# Patient Record
Sex: Male | Born: 2000 | Race: Black or African American | Hispanic: No | Marital: Single | Smoking: Never smoker
Health system: Southern US, Community
[De-identification: ages and names within clinical notes are randomized; demographics above are authoritative.]

## PROBLEM LIST (undated history)

## (undated) HISTORY — PX: KNEE SURGERY: SHX244

---
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
## 2019-05-17 ENCOUNTER — Emergency Department (HOSPITAL_COMMUNITY)
Admission: EM | Admit: 2019-05-17 | Discharge: 2019-05-17 | Disposition: A | Discharge status: Home / Self Care | Payer: BC Managed Care – PPO | Attending: Emergency Medicine | Admitting: Emergency Medicine

## 2019-05-17 ENCOUNTER — Emergency Department (HOSPITAL_COMMUNITY): Payer: BC Managed Care – PPO

## 2019-05-17 ENCOUNTER — Encounter (HOSPITAL_COMMUNITY): Payer: Self-pay

## 2019-05-17 ENCOUNTER — Other Ambulatory Visit: Payer: Self-pay

## 2019-05-17 DIAGNOSIS — M25561 Pain in right knee: Secondary | ICD-10-CM

## 2019-05-17 DIAGNOSIS — Y9361 Activity, american tackle football: Secondary | ICD-10-CM | POA: Insufficient documentation

## 2019-05-17 DIAGNOSIS — Z79899 Other long term (current) drug therapy: Secondary | ICD-10-CM | POA: Diagnosis not present

## 2019-05-17 DIAGNOSIS — Y929 Unspecified place or not applicable: Secondary | ICD-10-CM | POA: Diagnosis not present

## 2019-05-17 DIAGNOSIS — X500XXA Overexertion from strenuous movement or load, initial encounter: Secondary | ICD-10-CM | POA: Insufficient documentation

## 2019-05-17 DIAGNOSIS — Y999 Unspecified external cause status: Secondary | ICD-10-CM | POA: Diagnosis not present

## 2019-05-17 MED ORDER — NAPROXEN 250 MG PO TABS
500.0000 mg | ORAL_TABLET | Freq: Once | ORAL | Status: AC
  Administered 2019-05-17: 500 mg via ORAL
  Filled 2019-05-17: qty 2

## 2019-05-17 MED ORDER — NAPROXEN 500 MG PO TABS: 500.0000 mg | ORAL_TABLET | Freq: Two times a day (BID) | ORAL | 0 refills | Status: AC

## 2019-05-17 NOTE — ED Provider Notes (Signed)
[facility]   CSN: [id number] Arrival date & time: 05/17/19  2036    History   Chief Complaint Chief Complaint  Patient presents with  . Knee Injury    HPI Douglas Mendez is a 18 y.o. male.     18 y.o male with no PMH presents to the ED with a chief complaint of right knee after football. Patient reports he was playing football when he tried to run, after having his right leg step forward, he felt "a pop on his knee" after this he was able to ambulate but with difficulty. He reports pain along the top of the right knee worse with ambulation along with weight bearing. He denies any medication for relieve in symptoms. Patient denies any other medical history.   The history is provided by the patient.    History reviewed. No pertinent past medical history.  There are no active problems to display for this patient.   History reviewed. No pertinent surgical history.      Home Medications    Prior to Admission medications   Medication Sig Start Date End Date Taking? Authorizing Provider  meloxicam (MOBIC) 15 MG tablet Take 1 tablet (15 mg total) by mouth daily. 09/18/17   Cuthriell, Delorise RoyalsJonathan D, PA-C  naproxen (NAPROSYN) 500 MG tablet Take 1 tablet (500 mg total) by mouth 2 (two) times daily for 7 days. 05/17/19 05/24/19  Claude MangesSoto, Ciela Mahajan, PA-C    Family History No family history on file.  Social History Social History   Tobacco Use  . Smoking status: Not on file  Substance Use Topics  . Alcohol use: Not on file  . Drug use: Not on file     Allergies   Patient has no known allergies.   Review of Systems Review of Systems  Constitutional: Negative for fever.  Musculoskeletal: Positive for arthralgias, joint swelling and myalgias.     Physical Exam Updated Vital Signs BP (!) 115/99   Pulse 90   Temp 99 F (37.2 C) (Oral)   Resp 16   SpO2 100%   Physical Exam Vitals signs and nursing note reviewed.   Constitutional:      Appearance: He is well-developed.  HENT:     Head: Normocephalic and atraumatic.  Eyes:     General: No scleral icterus.    Pupils: Pupils are equal, round, and reactive to light.  Neck:     Musculoskeletal: Normal range of motion.  Cardiovascular:     Heart sounds: Normal heart sounds.  Pulmonary:     Effort: Pulmonary effort is normal.     Breath sounds: Normal breath sounds. No wheezing.  Chest:     Chest wall: No tenderness.  Abdominal:     General: Bowel sounds are normal. There is no distension.     Palpations: Abdomen is soft.     Tenderness: There is no abdominal tenderness.  Musculoskeletal:        General: No deformity.     Right knee: He exhibits decreased range of motion, swelling and effusion. He exhibits no ecchymosis, no deformity, no laceration, no erythema and normal alignment. Tenderness found. Patellar tendon tenderness noted.       Legs:     Comments: Pulses present, limited ROM due to pain   Skin:    General: Skin is warm and dry.  Neurological:     Mental Status: He is alert and oriented to person, place, and time.  ED Treatments / Results  Labs (all labs ordered are listed, but only abnormal results are displayed) Labs Reviewed - No data to display  EKG None  Radiology Dg Knee Complete 4 Views Right  Result Date: 05/17/2019 CLINICAL DATA:  Injury playing football today. Right knee pain and swelling. Initial encounter. EXAM: RIGHT KNEE - COMPLETE 4+ VIEW COMPARISON:  None. FINDINGS: No evidence of fracture or dislocation. A large knee joint effusion is seen. No evidence of arthropathy or other focal bone abnormality. Soft tissues are unremarkable. IMPRESSION: Large knee joint effusion. No evidence of fracture or dislocation. Electronically Signed   By: Marlaine Hind M.D.   On: 05/17/2019 21:41    Procedures Procedures (including critical care time)  Medications Ordered in ED Medications  naproxen (NAPROSYN) tablet 500  mg (has no administration in time range)     Initial Impression / Assessment and Plan / ED Course  I have reviewed the triage vital signs and the nursing notes.  Pertinent labs & imaging results that were available during my care of the patient were reviewed by me and considered in my medical decision making (see chart for details).    Patient with no PMH presents to the ED with a chief complaint of right knee pain, reports this occurred when he was playing football. Describes a popping sensation to the upper part of his right knee. No medication prior to arrival. During evaluation swelling noted to the upper patella region, limited ROM due to pain. Strength is limited with knee extension and flexion due to pain. DG right knee showed a large joint effusion. Will place patient in knee immobilizer along with crutches and orthopedic follow up. He will also be dc with NSAIDS and RICE therapy. He understands and agrees with management.    Portions of this note were generated with Lobbyist. Dictation errors may occur despite best attempts at proofreading.   Final Clinical Impressions(s) / ED Diagnoses   Final diagnoses:  Acute pain of right knee    ED Discharge Orders         Ordered    naproxen (NAPROSYN) 500 MG tablet  2 times daily     05/17/19 2216           Janeece Fitting, PA-C 05/17/19 2222    Lennice Sites, DO 05/17/19 2243

## 2019-05-17 NOTE — Discharge Instructions (Signed)
Please keep your knee on a brace until your orthopedist follow up. Call and schedule an appointment for further evaluation. You may ambulate with crutches and elevate your leg while resting at home. A prescription for antiinflammatories is attached to your chart, please take as prescribed.

## 2019-05-17 NOTE — ED Notes (Signed)
Patient verbalizes understanding of discharge instructions. Opportunity for questioning and answers were provided. Armband removed by staff, pt discharged from ED in wheelchair but ambulatory with crutches given.

## 2019-05-17 NOTE — ED Triage Notes (Signed)
Pt states he was playing football tonight and heard his right knee pop. Mild swelling noted, pt ambulatory.

## 2019-10-30 IMAGING — CR RIGHT KNEE - COMPLETE 4+ VIEW
4 series · 4 of 4 positions shown · non-contrast
Comparison: None.

CLINICAL DATA: Injury playing football today. Right knee pain and
swelling. Initial encounter.

EXAM:
RIGHT KNEE - COMPLETE 4+ VIEW

[knee ap]
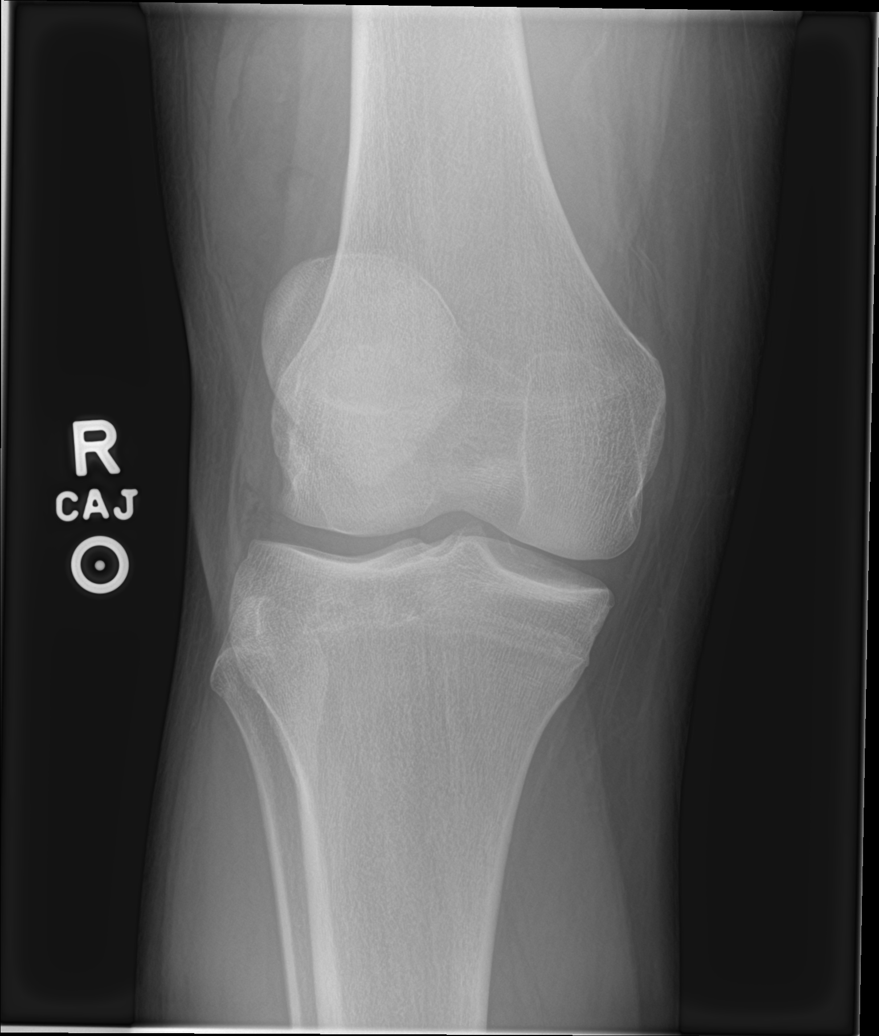

[knee lat]
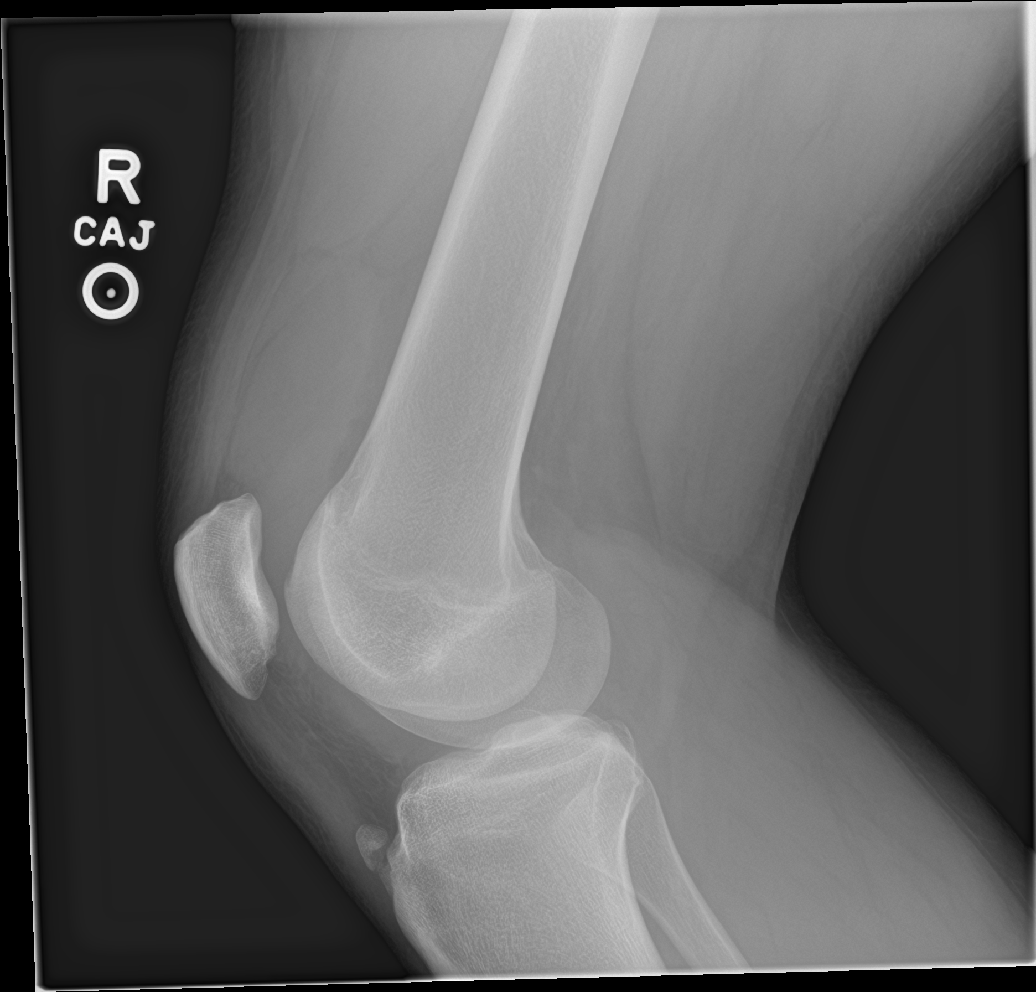

[knee obl (1 of 2)]
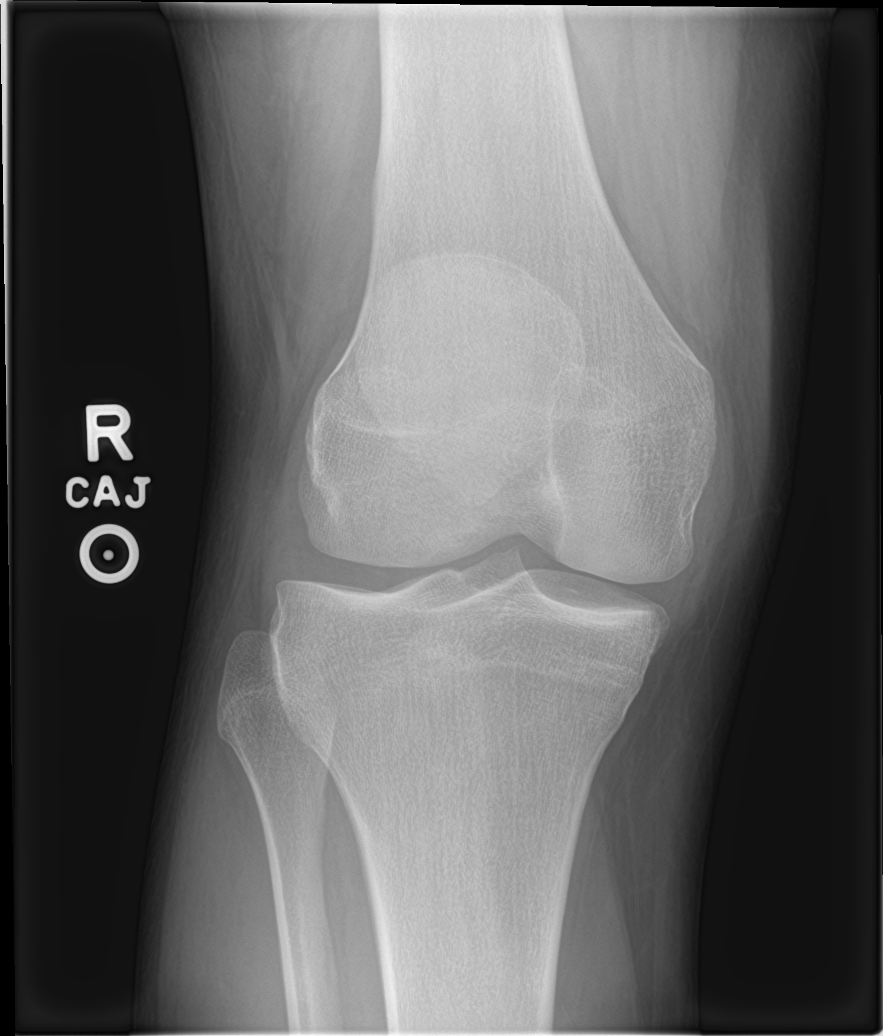

[knee obl (2 of 2)]
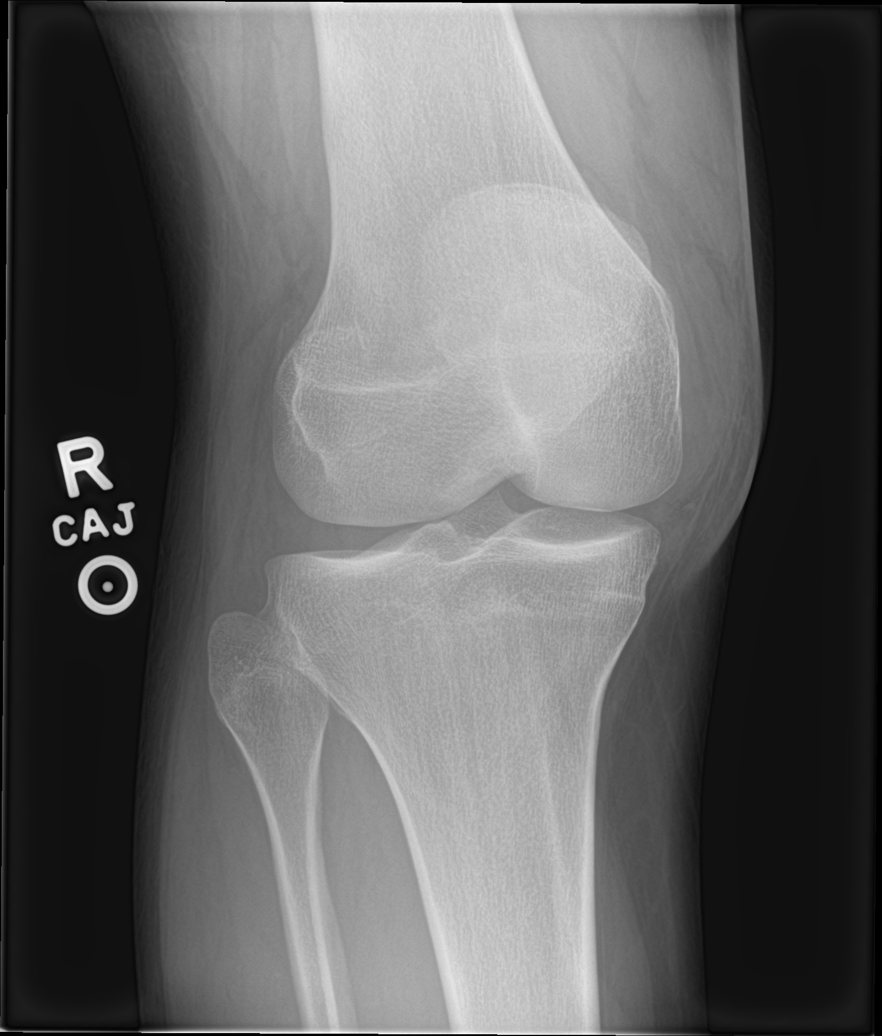

[4 of 4 positions shown; findings below may reference images not displayed]

FINDINGS: No evidence of fracture or dislocation. A large knee joint effusion
is seen. No evidence of arthropathy or other focal bone abnormality.
Soft tissues are unremarkable.
IMPRESSION: Large knee joint effusion. No evidence of fracture or dislocation.

## 2019-12-01 ENCOUNTER — Ambulatory Visit: Payer: BC Managed Care – PPO | Attending: Internal Medicine

## 2019-12-01 DIAGNOSIS — Z20822 Contact with and (suspected) exposure to covid-19: Secondary | ICD-10-CM

## 2019-12-02 LAB — NOVEL CORONAVIRUS, NAA: SARS-CoV-2, NAA: NOT DETECTED

## 2019-12-04 ENCOUNTER — Telehealth: Payer: Self-pay

## 2019-12-04 NOTE — Telephone Encounter (Signed)
Pt notified of negative COVID-19 results. Understanding verbalized. He requested to have his results faxed to [phone]. Attention: Bayard Hugger    [facility]

## 2019-12-04 NOTE — Telephone Encounter (Signed)
Result faxed as requested. 

## 2020-03-29 ENCOUNTER — Ambulatory Visit: Payer: BC Managed Care – PPO | Attending: Internal Medicine

## 2020-03-29 DIAGNOSIS — Z23 Encounter for immunization: Secondary | ICD-10-CM

## 2020-03-29 NOTE — Progress Notes (Signed)
   Covid-19 Vaccination Clinic  Name:  LADAVION SAVITZ    MRN: [id number] DOB: Nov 26, 2000  03/29/2020  Mr. Ratz was observed post Covid-19 immunization for 15 minutes without incident. He was provided with Vaccine Information Sheet and instruction to access the V-Safe system.   Mr. Relph was instructed to call 911 with any severe reactions post vaccine: Marland Kitchen Difficulty breathing  . Swelling of face and throat  . A fast heartbeat  . A bad rash all over body  . Dizziness and weakness   Immunizations Administered    Name Date Dose VIS Date Route   Pfizer COVID-19 Vaccine 03/29/2020 11:17 AM 0.3 mL 01/13/2019 Intramuscular   Manufacturer: [employer], [employer]   Lot: [id number]   NDC: [address]-7

## 2020-04-19 ENCOUNTER — Ambulatory Visit: Payer: BC Managed Care – PPO | Attending: Internal Medicine

## 2020-04-19 DIAGNOSIS — Z23 Encounter for immunization: Secondary | ICD-10-CM

## 2020-04-19 NOTE — Progress Notes (Signed)
   Covid-19 Vaccination Clinic  Name:  Douglas Mendez    MRN: [id number] DOB: 09/06/2001  04/19/2020  Douglas Mendez was observed post Covid-19 immunization for 15 minutes without incident. He was provided with Vaccine Information Sheet and instruction to access the V-Safe system.   Douglas Mendez was instructed to call 911 with any severe reactions post vaccine: Marland Kitchen Difficulty breathing  . Swelling of face and throat  . A fast heartbeat  . A bad rash all over body  . Dizziness and weakness   Immunizations Administered    Name Date Dose VIS Date Route   Pfizer COVID-19 Vaccine 04/19/2020  9:20 AM 0.3 mL 01/13/2019 Intramuscular   Manufacturer: [employer], [employer]   Lot: [id number]   NDC: [address]-4

## 2020-12-09 ENCOUNTER — Ambulatory Visit
Admission: RE | Admit: 2020-12-09 | Discharge: 2020-12-09 | Disposition: A | Discharge status: Home / Self Care | Payer: BC Managed Care – PPO | Source: Ambulatory Visit | Attending: Family Medicine | Admitting: Family Medicine

## 2020-12-09 ENCOUNTER — Other Ambulatory Visit: Payer: Self-pay

## 2020-12-09 VITALS — BP 109/46 | HR 67 | Temp 98.2°F | Resp 18 | Wt 200.0 lb

## 2020-12-09 DIAGNOSIS — Z20822 Contact with and (suspected) exposure to covid-19: Secondary | ICD-10-CM | POA: Diagnosis not present

## 2020-12-09 NOTE — ED Triage Notes (Signed)
Needs a COVID test for school.

## 2020-12-13 LAB — COVID-19, FLU A+B NAA
Influenza A, NAA: NOT DETECTED
Influenza B, NAA: NOT DETECTED
SARS-CoV-2, NAA: NOT DETECTED

## 2021-01-30 ENCOUNTER — Ambulatory Visit (HOSPITAL_COMMUNITY)
Admission: RE | Admit: 2021-01-30 | Discharge: 2021-01-30 | Disposition: A | Discharge status: Home / Self Care | Payer: BC Managed Care – PPO | Source: Ambulatory Visit | Attending: Urgent Care | Admitting: Urgent Care

## 2021-01-30 ENCOUNTER — Ambulatory Visit (INDEPENDENT_AMBULATORY_CARE_PROVIDER_SITE_OTHER): Payer: BC Managed Care – PPO

## 2021-01-30 ENCOUNTER — Other Ambulatory Visit: Payer: Self-pay

## 2021-01-30 ENCOUNTER — Encounter (HOSPITAL_COMMUNITY): Payer: Self-pay

## 2021-01-30 VITALS — BP 105/65 | HR 68 | Temp 98.0°F | Resp 20

## 2021-01-30 DIAGNOSIS — S6991XA Unspecified injury of right wrist, hand and finger(s), initial encounter: Secondary | ICD-10-CM

## 2021-01-30 DIAGNOSIS — M79641 Pain in right hand: Secondary | ICD-10-CM | POA: Diagnosis not present

## 2021-01-30 DIAGNOSIS — Y9364 Activity, baseball: Secondary | ICD-10-CM | POA: Diagnosis not present

## 2021-01-30 MED ORDER — NAPROXEN 500 MG PO TABS: 500.0000 mg | ORAL_TABLET | Freq: Two times a day (BID) | ORAL | 0 refills | Status: AC

## 2021-01-30 NOTE — ED Provider Notes (Signed)
  [facility]   MRN: [id number] DOB: [birth year]  Subjective:   Douglas Mendez is a 20 y.o. male presenting for 1 day history of right hand pain, swelling. Patient was playing baseball and slid for a base. Felt his hand twist under him, heard a pop. Denies bruising, bony deformity. Has not taken any medications for relief.   No current facility-administered medications for this encounter.  Current Outpatient Medications:  .  meloxicam (MOBIC) 15 MG tablet, Take 1 tablet (15 mg total) by mouth daily., Disp: 30 tablet, Rfl: 0   No Known Allergies  History reviewed. No pertinent past medical history.   Past Surgical History:  Procedure Laterality Date  . KNEE SURGERY      Family History  Family history unknown: Yes    Social History   Tobacco Use  . Smoking status: Never Smoker    ROS   Objective:   Vitals: BP 105/65 (BP Location: Right Arm)   Pulse 68   Temp 98 F (36.7 C) (Oral)   Resp 20   SpO2 97%   Physical Exam Constitutional:      General: He is not in acute distress.    Appearance: Normal appearance. He is well-developed and normal weight. He is not ill-appearing, toxic-appearing or diaphoretic.  HENT:     Head: Normocephalic and atraumatic.     Right Ear: External ear normal.     Left Ear: External ear normal.     Nose: Nose normal.     Mouth/Throat:     Pharynx: Oropharynx is clear.  Eyes:     General: No scleral icterus.       Right eye: No discharge.        Left eye: No discharge.     Extraocular Movements: Extraocular movements intact.     Pupils: Pupils are equal, round, and reactive to light.  Cardiovascular:     Rate and Rhythm: Normal rate.  Pulmonary:     Effort: Pulmonary effort is normal.  Musculoskeletal:       Hands:     Cervical back: Normal range of motion.  Neurological:     Mental Status: He is alert and oriented to person, place, and time.  Psychiatric:        Mood and Affect: Mood normal.         Behavior: Behavior normal.        Thought Content: Thought content normal.        Judgment: Judgment normal.    DG Hand Complete Right  Result Date: 01/30/2021 CLINICAL DATA:  Baseball player with right hand injury, most under in the second and third fingers EXAM: RIGHT HAND - COMPLETE 3+ VIEW COMPARISON:  None. FINDINGS: There is no evidence of fracture or dislocation. There is no evidence of arthropathy or other focal bone abnormality. Soft tissues are unremarkable. IMPRESSION: No fracture or malalignment. Electronically Signed   By: Delbert Phenix M.D.   On: 01/30/2021 12:41    Assessment and Plan :   PDMP not reviewed this encounter.  1. Right hand pain     Will manage for strain, musculoskeletal pain associated with his sport. Recommended RICE, naproxen. Counseled patient on potential for adverse effects with medications prescribed/recommended today, ER and return-to-clinic precautions discussed, patient verbalized understanding.    Wallis Bamberg, PA-C 01/30/21 1325

## 2021-01-30 NOTE — ED Triage Notes (Signed)
Pt presents with right hand injury/ pain after twisting while playing sports yesterday: pt states he heard a loud pop, can move it but has sharp pain with movement.

## 2021-05-17 ENCOUNTER — Ambulatory Visit
Admission: RE | Admit: 2021-05-17 | Discharge: 2021-05-17 | Disposition: A | Discharge status: Home / Self Care | Payer: BC Managed Care – PPO | Source: Ambulatory Visit | Attending: Family Medicine | Admitting: Family Medicine

## 2021-05-17 ENCOUNTER — Other Ambulatory Visit: Payer: Self-pay

## 2021-05-17 DIAGNOSIS — Z1152 Encounter for screening for COVID-19: Secondary | ICD-10-CM

## 2021-05-17 NOTE — ED Triage Notes (Signed)
Needs covid test for travel  

## 2021-05-18 LAB — NOVEL CORONAVIRUS, NAA: SARS-CoV-2, NAA: NOT DETECTED

## 2021-05-18 LAB — SARS-COV-2, NAA 2 DAY TAT
